# Patient Record
Sex: Male | Born: 1980 | Race: White | Hispanic: No | Marital: Married | Smoking: Never smoker
Health system: Southern US, Community
[De-identification: ages and names within clinical notes are randomized; demographics above are authoritative.]

## PROBLEM LIST (undated history)

## (undated) DIAGNOSIS — K460 Unspecified abdominal hernia with obstruction, without gangrene: Secondary | ICD-10-CM

---

## 2005-02-06 ENCOUNTER — Emergency Department (HOSPITAL_COMMUNITY): Admission: EM | Admit: 2005-02-06 | Discharge: 2005-02-06 | Payer: Self-pay | Admitting: Emergency Medicine

## 2010-06-05 ENCOUNTER — Emergency Department (HOSPITAL_COMMUNITY)
Admission: EM | Admit: 2010-06-05 | Discharge: 2010-06-06 | Disposition: A | Discharge status: Home / Self Care | Payer: BC Managed Care – PPO | Attending: Emergency Medicine | Admitting: Emergency Medicine

## 2010-06-05 DIAGNOSIS — R509 Fever, unspecified: Secondary | ICD-10-CM | POA: Insufficient documentation

## 2010-06-05 DIAGNOSIS — M542 Cervicalgia: Secondary | ICD-10-CM | POA: Insufficient documentation

## 2010-06-05 DIAGNOSIS — M2569 Stiffness of other specified joint, not elsewhere classified: Secondary | ICD-10-CM | POA: Insufficient documentation

## 2010-06-06 LAB — DIFFERENTIAL
Basophils Absolute: 0 10*3/uL (ref 0.0–0.1)
Basophils Relative: 1 % (ref 0–1)
Eosinophils Absolute: 0.2 10*3/uL (ref 0.0–0.7)
Lymphs Abs: 1.7 10*3/uL (ref 0.7–4.0)
Neutrophils Relative %: 60 % (ref 43–77)

## 2010-06-06 LAB — BASIC METABOLIC PANEL
Chloride: 101 mEq/L (ref 96–112)
GFR calc Af Amer: >60 mL/min (ref >60)
GFR calc non Af Amer: >60 mL/min (ref >60)
Potassium: 4.2 mEq/L (ref 3.5–5.1)
Sodium: 138 mEq/L (ref 135–145)

## 2010-06-06 LAB — CSF CELL COUNT WITH DIFFERENTIAL

## 2010-06-06 LAB — PROTEIN AND GLUCOSE, CSF
Glucose, CSF: 67 mg/dL (ref 43–76)
Total  Protein, CSF: 29 mg/dL (ref 15–45)

## 2010-06-06 LAB — GRAM STAIN

## 2010-06-06 LAB — CBC
MCV: 93.4 fL (ref 78.0–100.0)
Platelets: 209 10*3/uL (ref 150–400)
RBC: 5 MIL/uL (ref 4.22–5.81)
RDW: 12.1 % (ref 11.5–15.5)
WBC: 6.9 10*3/uL (ref 4.0–10.5)

## 2010-06-09 LAB — CSF CULTURE W GRAM STAIN

## 2018-08-12 ENCOUNTER — Emergency Department (HOSPITAL_COMMUNITY): Payer: BC Managed Care – PPO

## 2018-08-12 ENCOUNTER — Encounter (HOSPITAL_COMMUNITY): Payer: Self-pay | Admitting: *Deleted

## 2018-08-12 ENCOUNTER — Emergency Department (HOSPITAL_COMMUNITY)
Admission: EM | Admit: 2018-08-12 | Discharge: 2018-08-12 | Disposition: A | Discharge status: Home / Self Care | Payer: BC Managed Care – PPO | Attending: Emergency Medicine | Admitting: Emergency Medicine

## 2018-08-12 ENCOUNTER — Other Ambulatory Visit: Payer: Self-pay

## 2018-08-12 DIAGNOSIS — T18108A Unspecified foreign body in esophagus causing other injury, initial encounter: Secondary | ICD-10-CM | POA: Insufficient documentation

## 2018-08-12 DIAGNOSIS — Y9389 Activity, other specified: Secondary | ICD-10-CM | POA: Diagnosis not present

## 2018-08-12 DIAGNOSIS — Y999 Unspecified external cause status: Secondary | ICD-10-CM | POA: Insufficient documentation

## 2018-08-12 DIAGNOSIS — Y929 Unspecified place or not applicable: Secondary | ICD-10-CM | POA: Diagnosis not present

## 2018-08-12 DIAGNOSIS — R51 Headache: Secondary | ICD-10-CM | POA: Diagnosis not present

## 2018-08-12 DIAGNOSIS — T18128A Food in esophagus causing other injury, initial encounter: Secondary | ICD-10-CM | POA: Diagnosis present

## 2018-08-12 DIAGNOSIS — X58XXXA Exposure to other specified factors, initial encounter: Secondary | ICD-10-CM | POA: Diagnosis not present

## 2018-08-12 HISTORY — DX: Unspecified abdominal hernia with obstruction, without gangrene: K46.0

## 2018-08-12 NOTE — Discharge Instructions (Addendum)
Take your usual prescriptions as previously directed. Eat a soft diet. Take only small bites and chew your food well before swallowing. Call your regular GI doctor tomorrow to schedule a follow up appointment this week.  Return to the Emergency Department immediately sooner if worsening.

## 2018-08-12 NOTE — ED Notes (Signed)
Patient able to drink fluids. After fluid challenge with Ginger Ale, he states he does not feel anything is in his throat. Pt states the object in throat was swallowed successfully.

## 2018-08-12 NOTE — ED Triage Notes (Signed)
Pt with steak caught in esophagus for past 2 hours, pt with known hiatal hernia.

## 2018-08-12 NOTE — ED Triage Notes (Signed)
Pt also fell down stairs and hitting head on steps, denies LOC.

## 2018-08-12 NOTE — ED Notes (Signed)
Pt states he is able to swallow but that it is painful. Denies trouble breathing.

## 2018-08-12 NOTE — ED Provider Notes (Signed)
[facility] Provider Note   CSN: [id number] Arrival date & time: 08/12/18  2022    History   Chief Complaint Chief Complaint  Patient presents with   Swallowed Foreign Body    HPI Louis Durham is a 38 y.o. male.     HPI  Pt was seen at 2055. Per pt, c/o sudden onset and persistence of constant "food stuck in my esophagus" that occurred approximately 1845 PTA. Pt states he was eating steak and "it got caught up." Pt states he tried to make himself vomit it up, but he cannot. Denies SOB, no wheezing/stridor, no back pain, no abd pain, no diarrhea, no fevers, no cough.    Past Medical History:  Diagnosis Date   Hernia with obstruction     There are no active problems to display for this patient.   History reviewed. No pertinent surgical history.      Home Medications    Prior to Admission medications   Not on File    Family History History reviewed. No pertinent family history.  Social History Social History   Tobacco Use   Smoking status: Never Smoker   Smokeless tobacco: Never Used  Substance Use Topics   Alcohol use: Never    Frequency: Never   Drug use: Never     Allergies   Sulfa antibiotics   Review of Systems Review of Systems ROS: Statement: All systems negative except as marked or noted in the HPI; Constitutional: Negative for fever and chills. ; ; Eyes: Negative for eye pain, redness and discharge. ; ; ENMT: Negative for ear pain, hoarseness, nasal congestion, sinus pressure and sore throat. ; ; Cardiovascular: Negative for chest pain, palpitations, diaphoresis, dyspnea and peripheral edema. ; ; Respiratory: Negative for cough, wheezing and stridor. ; ; Gastrointestinal: +esophageal FB. Negative for nausea, vomiting, diarrhea, abdominal pain, blood in stool, hematemesis, jaundice and rectal bleeding. . ; ; Genitourinary: Negative for dysuria, flank pain and hematuria. ; ; Musculoskeletal: Negative for back pain  and neck pain. Negative for swelling and deformity.; ; Skin: Negative for pruritus, rash, abrasions, blisters, bruising and skin lesion.; ; Neuro: Negative for headache, lightheadedness and neck stiffness. Negative for weakness, altered level of consciousness, altered mental status, extremity weakness, paresthesias, involuntary movement, seizure and syncope.       Physical Exam Updated Vital Signs BP (!) 139/97    Pulse 90    Temp 98.3 F (36.8 C) (Oral)    Resp 20    Ht 5\' 11"  (1.803 m)    Wt 92.5 kg    SpO2 99%    BMI 28.45 kg/m   Physical Exam 2100: Physical examination:  Nursing notes reviewed; Vital signs and O2 SAT reviewed;  Constitutional: Well developed, Well nourished, Well hydrated, Uncomfortable appearing.; Head:  Normocephalic, atraumatic; Eyes: EOMI, PERRL, No scleral icterus; ENMT: Mouth and pharynx normal, Mucous membranes moist; Neck: Supple, Full range of motion, No lymphadenopathy; Cardiovascular: Regular rate and rhythm, No gallop; Respiratory: Breath sounds clear & equal bilaterally, No wheezes. No stridor. No drooling. Speaking full sentences with ease, Normal respiratory effort/excursion; Chest: Nontender, Movement normal; Abdomen: Soft, Nontender, Nondistended, Normal bowel sounds; Genitourinary: No CVA tenderness; Extremities: Peripheral pulses normal, No tenderness, No edema, No calf edema or asymmetry.; Neuro: AA&Ox3, Major CN grossly intact.  Speech clear. No gross focal motor or sensory deficits in extremities.; Skin: Color normal, Warm, Dry.   ED Treatments / Results  Labs (all labs ordered are listed, but only abnormal results  are displayed)   EKG None  Radiology   Procedures Procedures (including critical care time)  Medications Ordered in ED Medications - No data to display   Initial Impression / Assessment and Plan / ED Course  I have reviewed the triage vital signs and the nursing notes.  Pertinent labs & imaging results that were available  during my care of the patient were reviewed by me and considered in my medical decision making (see chart for details).     MDM Reviewed: previous chart, nursing note and vitals Interpretation: x-ray and CT scan      Ct Head Wo Contrast Result Date: 08/12/2018 CLINICAL DATA:  Head trauma.  Pain to the back of the head. EXAM: CT HEAD WITHOUT CONTRAST TECHNIQUE: Contiguous axial images were obtained from the base of the skull through the vertex without intravenous contrast. COMPARISON:  None. FINDINGS: Brain: No evidence of acute infarction, hemorrhage, hydrocephalus, extra-axial collection or mass lesion/mass effect. Vascular: No hyperdense vessel or unexpected calcification. Skull: Normal. Negative for fracture or focal lesion. Sinuses/Orbits: No acute finding. Other: None. IMPRESSION: No acute intracranial abnormality. Electronically Signed   By: Katherine Mantle M.D.   On: 08/12/2018 22:13   Dg Abd Acute W/chest Result Date: 08/12/2018 CLINICAL DATA:  Esophageal foreign body EXAM: DG ABDOMEN ACUTE W/ 1V CHEST COMPARISON:  None. FINDINGS: There is no evidence of dilated bowel loops or free intraperitoneal air. No radiopaque calculi or other significant radiographic abnormality is seen. Heart size and mediastinal contours are within normal limits. Both lungs are clear. There is no evidence of pneumomediastinum. IMPRESSION: Negative abdominal radiographs.  No acute cardiopulmonary disease. Electronically Signed   By: Katherine Mantle M.D.   On: 08/12/2018 22:03     Louis Durham was evaluated in Emergency Department on 08/12/2018 for the symptoms described in the history of present illness. He was evaluated in the context of the global COVID-19 pandemic, which necessitated consideration that the patient might be at risk for infection with the SARS-CoV-2 virus that causes COVID-19. Institutional protocols and algorithms that pertain to the evaluation of patients at risk for COVID-19 are in  a state of rapid change based on information released by regulatory bodies including the CDC and federal and state organizations. These policies and algorithms were followed during the patient's care in the ED.    2245:  Pt "just mentioned" to ED RN that he fell down some steps and hit his head today. Denies LOC, no AMS, no neck or back pain. CT reassuring. Pt was given PO challenge. Pt stated that "it went down" and he feels "better now." AXR reassuring. Pt continues to feel well, able to fully swallow liquids without choking/gagging/coughing or regurgitating. Abd remains benign, resps easy, NAD, VSS. Pt states he has a GI MD in [facility] and can call him tomorrow to schedule follow up. Dx and testing, d/w pt.  Questions answered.  Verb understanding, agreeable to d/c home with outpt f/u.      Final Clinical Impressions(s) / ED Diagnoses   Final diagnoses:  Foreign body in esophagus, initial encounter    ED Discharge Orders    None       Samuel Jester, DO 08/16/18 [phone]

## 2020-02-10 IMAGING — DX DG ABDOMEN ACUTE W/ 1V CHEST
3 series · 3 of 3 positions shown · non-contrast
Comparison: None.

CLINICAL DATA: Esophageal foreign body

EXAM:
DG ABDOMEN ACUTE W/ 1V CHEST

[chest pa]
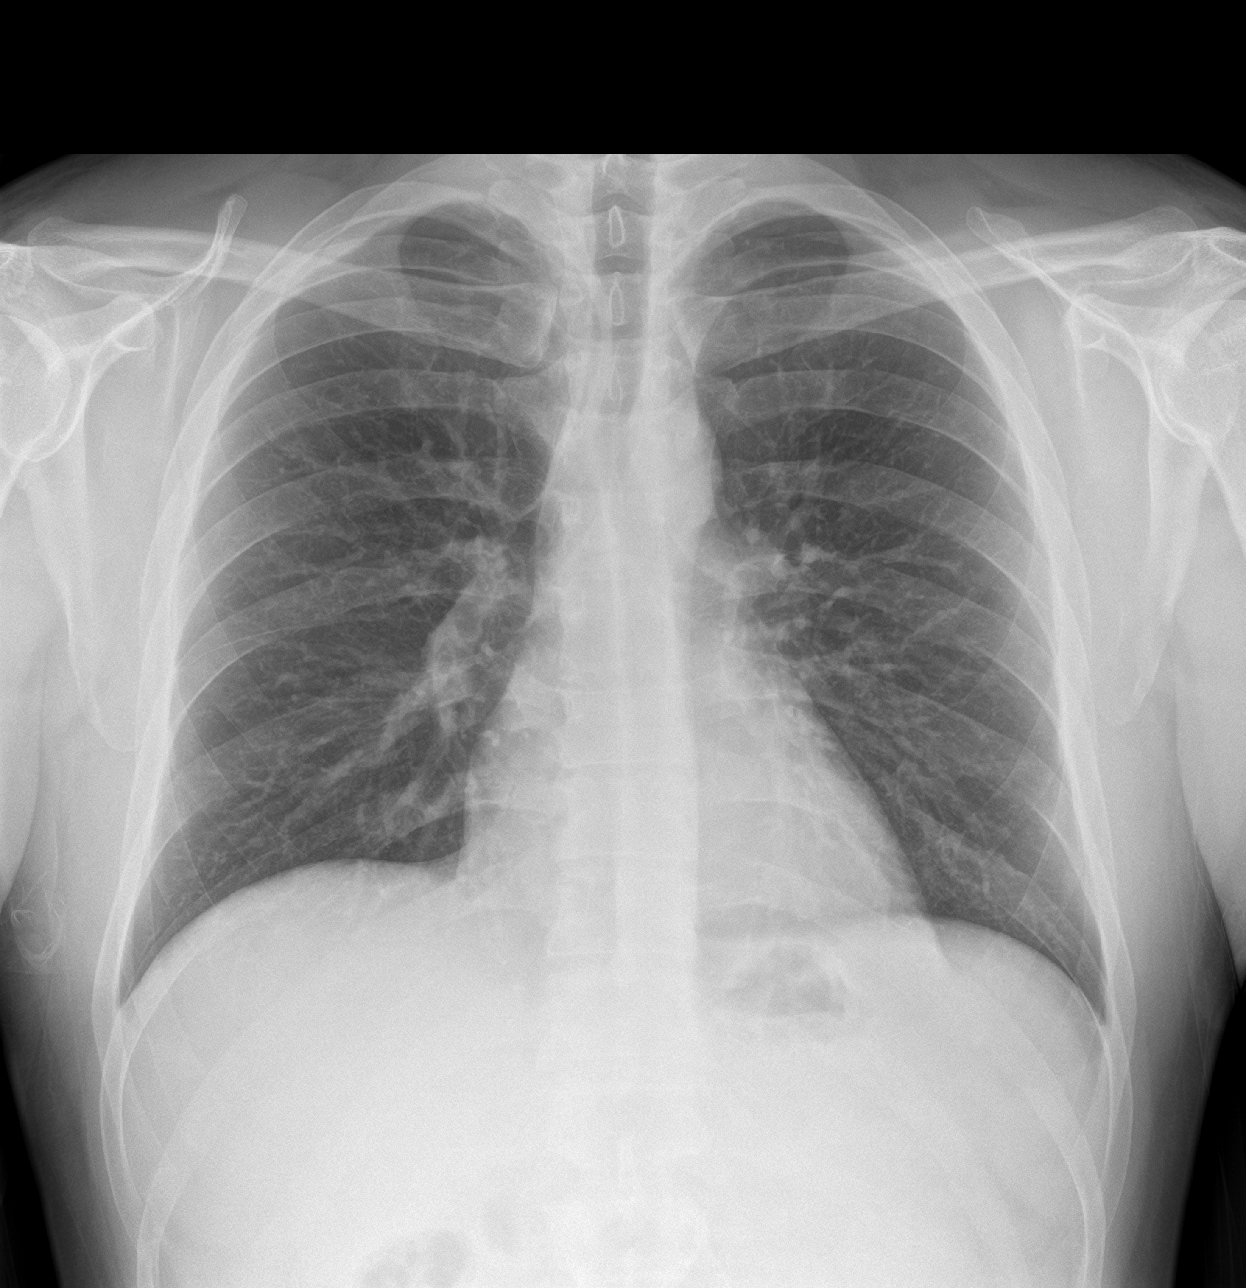

[abdomen erect]
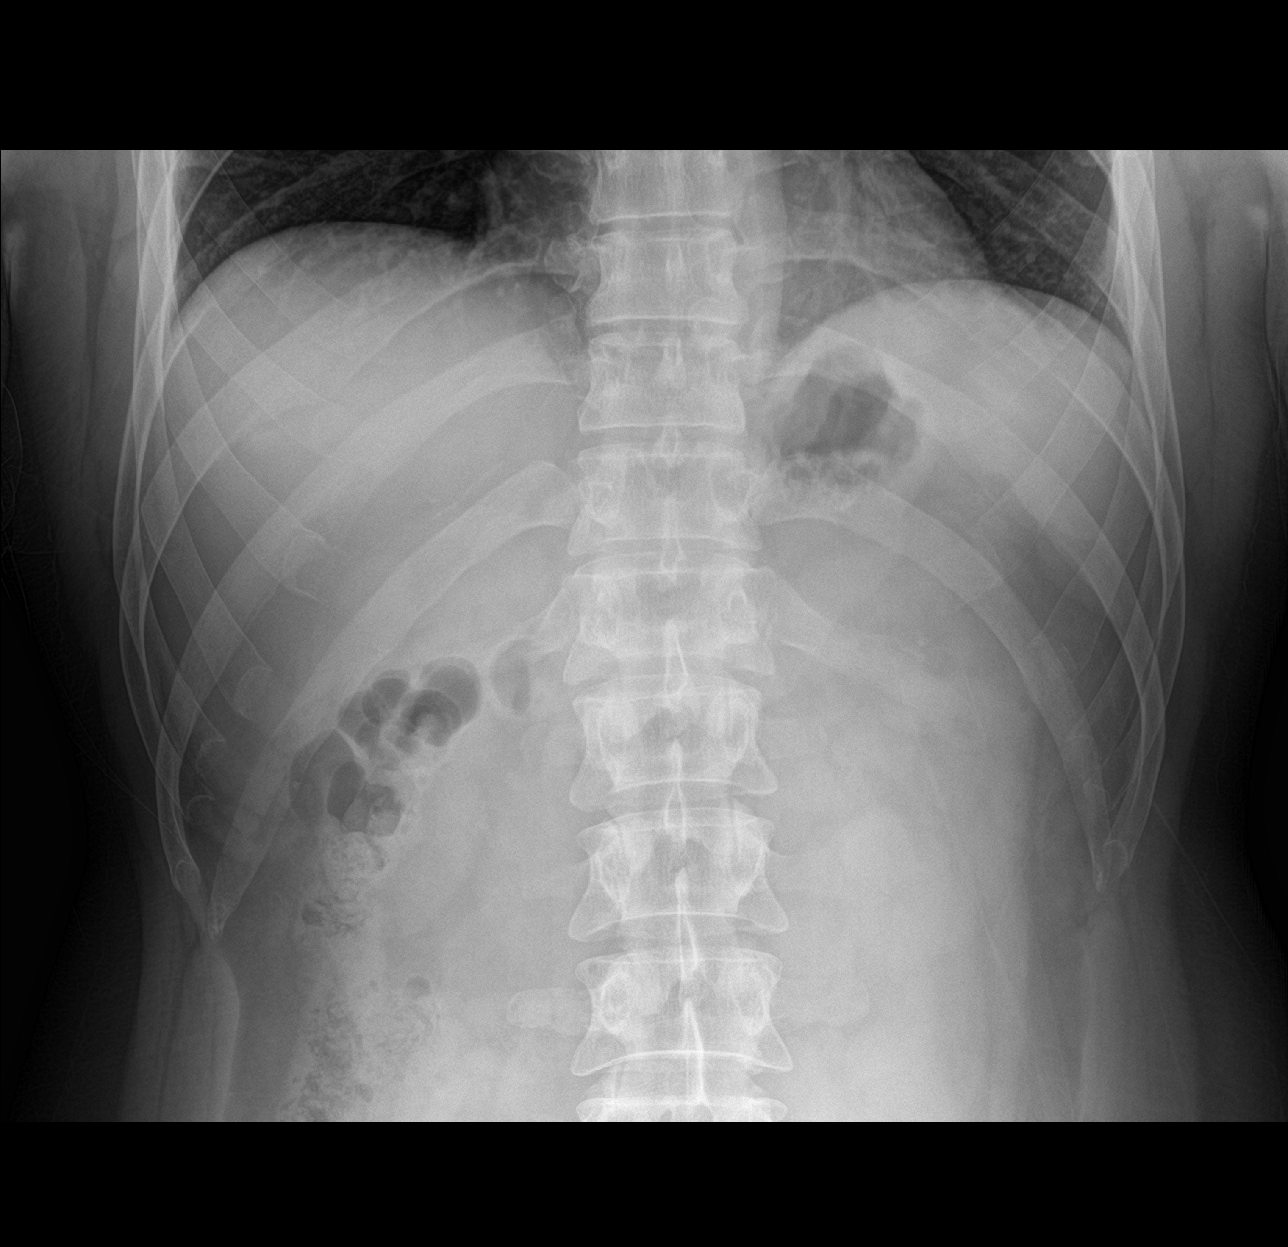

[abdomen supine]
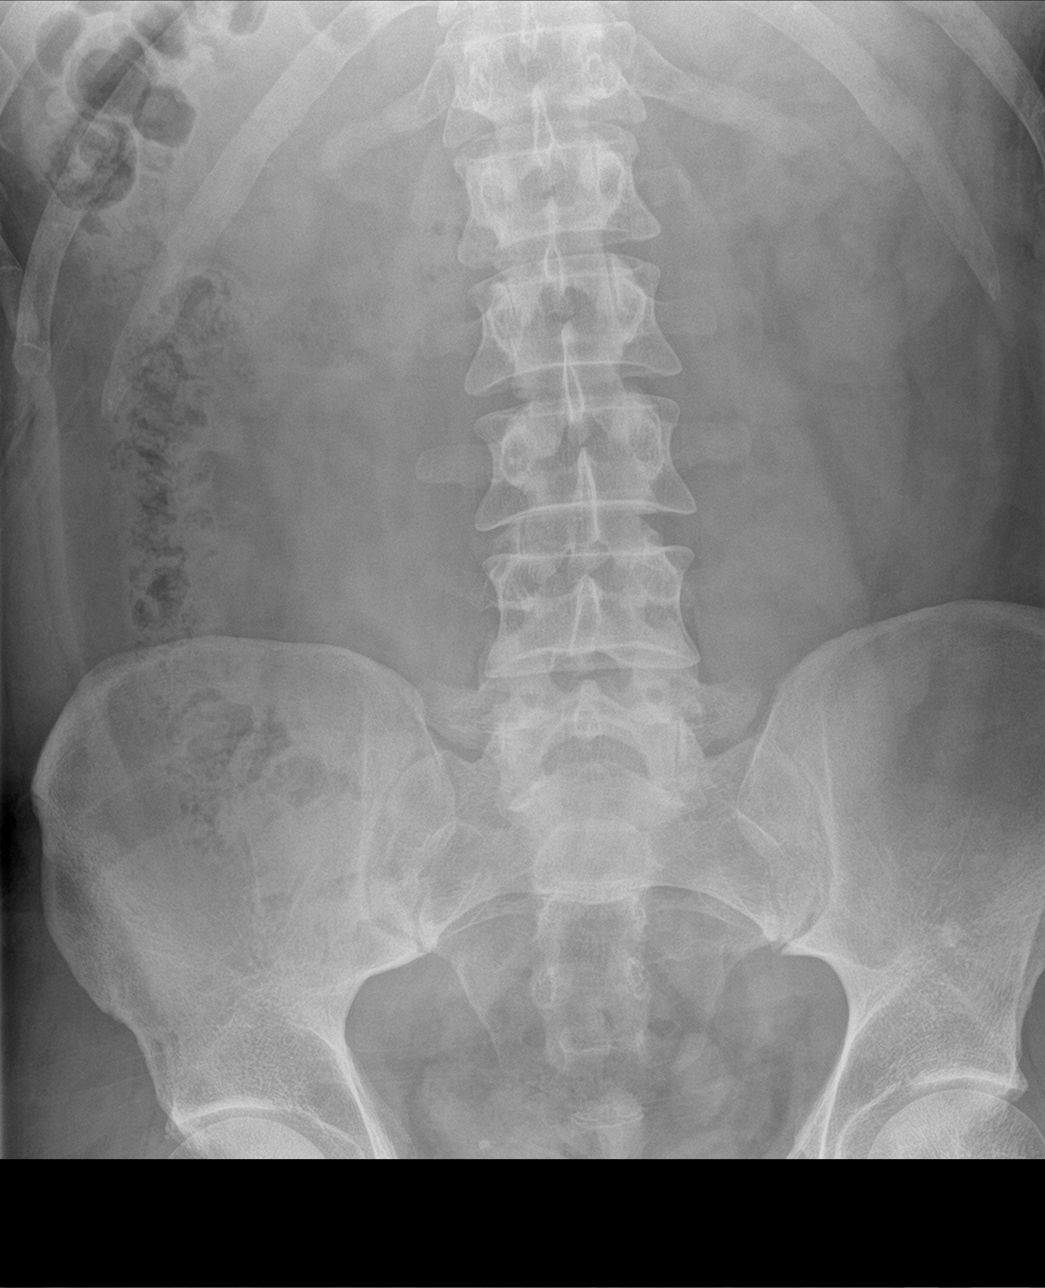

[3 of 3 positions shown; findings below may reference images not displayed]

FINDINGS: There is no evidence of dilated bowel loops or free intraperitoneal
air. No radiopaque calculi or other significant radiographic
abnormality is seen. Heart size and mediastinal contours are within
normal limits. Both lungs are clear. There is no evidence of
pneumomediastinum.
IMPRESSION: Negative abdominal radiographs.  No acute cardiopulmonary disease.
